# Patient Record
Sex: Male | Born: 1945 | Race: White | Hispanic: No | Marital: Married | Smoking: Never smoker
Health system: Southern US, Community
[De-identification: ages and names within clinical notes are randomized; demographics above are authoritative.]

---

## 2008-07-03 ENCOUNTER — Ambulatory Visit: Payer: Self-pay | Admitting: Internal Medicine

## 2008-08-12 ENCOUNTER — Ambulatory Visit: Payer: Self-pay | Admitting: Internal Medicine

## 2008-09-12 ENCOUNTER — Ambulatory Visit: Payer: Self-pay | Admitting: Surgery

## 2008-09-18 ENCOUNTER — Ambulatory Visit: Payer: Self-pay | Admitting: Surgery

## 2009-12-18 ENCOUNTER — Ambulatory Visit: Payer: Self-pay | Admitting: Unknown Physician Specialty

## 2010-01-27 ENCOUNTER — Ambulatory Visit: Payer: Self-pay | Admitting: Family Medicine

## 2010-03-26 ENCOUNTER — Encounter: Payer: Self-pay | Admitting: Neurological Surgery

## 2010-03-28 ENCOUNTER — Encounter: Payer: Self-pay | Admitting: Neurological Surgery

## 2010-04-28 ENCOUNTER — Encounter: Payer: Self-pay | Admitting: Neurological Surgery

## 2010-12-02 ENCOUNTER — Encounter
Admission: RE | Admit: 2010-12-02 | Discharge: 2010-12-02 | Payer: Self-pay | Source: Home / Self Care | Attending: Neurological Surgery | Admitting: Neurological Surgery

## 2011-01-17 ENCOUNTER — Other Ambulatory Visit: Payer: Self-pay | Admitting: Neurological Surgery

## 2011-01-17 DIAGNOSIS — M47812 Spondylosis without myelopathy or radiculopathy, cervical region: Secondary | ICD-10-CM

## 2011-01-19 ENCOUNTER — Other Ambulatory Visit: Payer: Self-pay

## 2011-01-19 ENCOUNTER — Ambulatory Visit
Admission: RE | Admit: 2011-01-19 | Discharge: 2011-01-19 | Disposition: A | Discharge status: Home / Self Care | Payer: 59 | Source: Ambulatory Visit | Attending: Neurological Surgery | Admitting: Neurological Surgery

## 2011-01-19 DIAGNOSIS — M47812 Spondylosis without myelopathy or radiculopathy, cervical region: Secondary | ICD-10-CM

## 2011-04-08 ENCOUNTER — Other Ambulatory Visit: Payer: Self-pay | Admitting: Neurological Surgery

## 2011-04-08 DIAGNOSIS — M47812 Spondylosis without myelopathy or radiculopathy, cervical region: Secondary | ICD-10-CM

## 2011-04-12 ENCOUNTER — Ambulatory Visit
Admission: RE | Admit: 2011-04-12 | Discharge: 2011-04-12 | Disposition: A | Discharge status: Home / Self Care | Payer: 59 | Source: Ambulatory Visit | Attending: Neurological Surgery | Admitting: Neurological Surgery

## 2011-04-12 DIAGNOSIS — M47812 Spondylosis without myelopathy or radiculopathy, cervical region: Secondary | ICD-10-CM

## 2011-05-03 ENCOUNTER — Ambulatory Visit: Payer: Self-pay | Admitting: Internal Medicine

## 2011-06-20 ENCOUNTER — Other Ambulatory Visit: Payer: Self-pay | Admitting: Neurological Surgery

## 2011-06-20 DIAGNOSIS — M47812 Spondylosis without myelopathy or radiculopathy, cervical region: Secondary | ICD-10-CM

## 2011-06-23 ENCOUNTER — Ambulatory Visit
Admission: RE | Admit: 2011-06-23 | Discharge: 2011-06-23 | Disposition: A | Discharge status: Home / Self Care | Payer: 59 | Source: Ambulatory Visit | Attending: Neurological Surgery | Admitting: Neurological Surgery

## 2011-06-23 DIAGNOSIS — M542 Cervicalgia: Secondary | ICD-10-CM

## 2011-06-23 DIAGNOSIS — M47812 Spondylosis without myelopathy or radiculopathy, cervical region: Secondary | ICD-10-CM

## 2011-06-23 MED ORDER — TRIAMCINOLONE ACETONIDE 40 MG/ML IJ SUSP (RADIOLOGY)
60.0000 mg | Freq: Once | INTRAMUSCULAR | Status: AC
  Administered 2011-06-23: 60 mg via EPIDURAL

## 2011-06-23 MED ORDER — IOHEXOL 300 MG/ML  SOLN
1.0000 mL | Freq: Once | INTRAMUSCULAR | Status: AC | PRN
  Administered 2011-06-23: 1 mL via EPIDURAL

## 2011-08-18 ENCOUNTER — Other Ambulatory Visit: Payer: Self-pay | Admitting: Neurological Surgery

## 2011-08-18 DIAGNOSIS — M47812 Spondylosis without myelopathy or radiculopathy, cervical region: Secondary | ICD-10-CM

## 2011-08-19 ENCOUNTER — Ambulatory Visit
Admission: RE | Admit: 2011-08-19 | Discharge: 2011-08-19 | Disposition: A | Discharge status: Home / Self Care | Payer: 59 | Source: Ambulatory Visit | Attending: Neurological Surgery | Admitting: Neurological Surgery

## 2011-08-19 DIAGNOSIS — M47812 Spondylosis without myelopathy or radiculopathy, cervical region: Secondary | ICD-10-CM

## 2011-08-19 MED ORDER — IOHEXOL 300 MG/ML  SOLN
1.0000 mL | Freq: Once | INTRAMUSCULAR | Status: AC | PRN
  Administered 2011-08-19: 1 mL via EPIDURAL

## 2011-08-19 MED ORDER — TRIAMCINOLONE ACETONIDE 40 MG/ML IJ SUSP (RADIOLOGY)
60.0000 mg | Freq: Once | INTRAMUSCULAR | Status: AC
  Administered 2011-08-19: 60 mg via EPIDURAL

## 2014-07-04 DIAGNOSIS — G4730 Sleep apnea, unspecified: Secondary | ICD-10-CM | POA: Insufficient documentation

## 2014-07-04 DIAGNOSIS — K219 Gastro-esophageal reflux disease without esophagitis: Secondary | ICD-10-CM | POA: Insufficient documentation

## 2014-07-29 ENCOUNTER — Ambulatory Visit (INDEPENDENT_AMBULATORY_CARE_PROVIDER_SITE_OTHER): Payer: Medicare Other | Admitting: Podiatry

## 2014-07-29 ENCOUNTER — Encounter: Payer: Self-pay | Admitting: Podiatry

## 2014-07-29 ENCOUNTER — Ambulatory Visit (INDEPENDENT_AMBULATORY_CARE_PROVIDER_SITE_OTHER): Payer: Medicare Other

## 2014-07-29 VITALS — BP 121/79 | HR 76 | Resp 16 | Ht 70.0 in | Wt 185.0 lb

## 2014-07-29 DIAGNOSIS — M722 Plantar fascial fibromatosis: Secondary | ICD-10-CM

## 2014-07-29 MED ORDER — DICLOFENAC SODIUM 75 MG PO TBEC: 75.0000 mg | DELAYED_RELEASE_TABLET | Freq: Two times a day (BID) | ORAL | Status: AC

## 2014-07-29 MED ORDER — TRIAMCINOLONE ACETONIDE 10 MG/ML IJ SUSP
10.0000 mg | Freq: Once | INTRAMUSCULAR | Status: AC
  Administered 2014-07-29: 10 mg

## 2014-07-29 NOTE — Progress Notes (Signed)
   Subjective:    Patient ID: Derek Cohen, male    DOB: 20-Nov-1946, 68 y.o.   MRN: [id number]  HPI Comments: Its the bottom of my rt foot. Its been going on for a couple of months. My foot has remained the same. It doesn't hurt all the time. It hurts when im up walking until it stretches out. It hurts after sitting. i will try to walk it out, i roll a tennis ball on my foot, soaks in warm soapy water.  Foot Pain      Review of Systems  HENT:       Ringing in ears   Musculoskeletal:       Difficulty walking   Neurological: Positive for light-headedness.  Hematological: Bruises/bleeds easily.  All other systems reviewed and are negative.      Objective:   Physical Exam        Assessment & Plan:

## 2014-07-29 NOTE — Progress Notes (Signed)
Subjective:     Patient ID: Derek Cohen, male   DOB: 1946-05-14, 68 y.o.   MRN: [id number]  Foot Pain   patient presents stating I'm having a lot of pain underneath my right heel that's been going on for about 6 months and has worsened over the last few months   Review of Systems  All other systems reviewed and are negative.      Objective:   Physical Exam  Nursing note and vitals reviewed. Constitutional: He is oriented to person, place, and time.  Cardiovascular: Intact distal pulses.   Musculoskeletal: Normal range of motion.  Neurological: He is oriented to person, place, and time.  Skin: Skin is warm.   neurovascular status intact with muscle strength adequate and range of motion subtalar midtarsal joint within normal limits. Patient's found to have good digital perfusion mild equinus and is well oriented x3. I noted exquisite discomfort plantar aspect right heel at the insertion of the tendon into the calcaneus    Assessment:     Plantar fasciitis acute right plantar heel    Plan:     H&P and x-rays reviewed and today I injected the plantar fascia right 3 mg Kenalog 5 mg Xylocaine and applied fascially brace and placed on oral anti-inflammatory diclofenac 75 mg twice a day. Reappoint her recheck in one week

## 2014-07-29 NOTE — Patient Instructions (Signed)

## 2014-08-05 ENCOUNTER — Ambulatory Visit (INDEPENDENT_AMBULATORY_CARE_PROVIDER_SITE_OTHER): Payer: Medicare Other | Admitting: Podiatry

## 2014-08-05 VITALS — BP 111/73 | HR 72 | Resp 16

## 2014-08-05 DIAGNOSIS — M722 Plantar fascial fibromatosis: Secondary | ICD-10-CM

## 2014-08-05 MED ORDER — TRIAMCINOLONE ACETONIDE 10 MG/ML IJ SUSP: 10.0000 mg | Freq: Once | INTRAMUSCULAR | Status: AC

## 2014-08-05 NOTE — Progress Notes (Signed)
Subjective:     Patient ID: Derek Cohen, male   DOB: [birth year], 68 y.o.   MRN: [id number]  HPI patient presents stating my right heel has been improved but it still is tender when I get up in the morning or after sitting   Review of Systems     Objective:   Physical Exam Neurovascular status intact with   pain in the right heel that still is present upon palpation Assessment:     Plantar fasciitis right with inflammation and fluid noted    Plan:     Reviewed physical therapy and the continuation of oral medicines and injected the right plantar fashion 3 mg Kenalog 5 mg Xylocaine and instructed on supportive shoe gear

## 2014-08-26 ENCOUNTER — Ambulatory Visit: Payer: Medicare Other | Admitting: Podiatry

## 2014-12-04 ENCOUNTER — Ambulatory Visit: Payer: Self-pay | Admitting: Unknown Physician Specialty

## 2015-03-23 LAB — SURGICAL PATHOLOGY

## 2016-01-20 DIAGNOSIS — L570 Actinic keratosis: Secondary | ICD-10-CM | POA: Diagnosis not present

## 2016-01-20 DIAGNOSIS — D225 Melanocytic nevi of trunk: Secondary | ICD-10-CM | POA: Diagnosis not present

## 2016-01-22 DIAGNOSIS — L570 Actinic keratosis: Secondary | ICD-10-CM | POA: Diagnosis not present

## 2016-01-22 DIAGNOSIS — X32XXXA Exposure to sunlight, initial encounter: Secondary | ICD-10-CM | POA: Diagnosis not present

## 2016-03-01 DIAGNOSIS — L570 Actinic keratosis: Secondary | ICD-10-CM | POA: Diagnosis not present

## 2016-07-28 DIAGNOSIS — G4733 Obstructive sleep apnea (adult) (pediatric): Secondary | ICD-10-CM | POA: Diagnosis not present

## 2016-07-28 DIAGNOSIS — Z0001 Encounter for general adult medical examination with abnormal findings: Secondary | ICD-10-CM | POA: Diagnosis not present

## 2016-07-28 DIAGNOSIS — Z125 Encounter for screening for malignant neoplasm of prostate: Secondary | ICD-10-CM | POA: Diagnosis not present

## 2016-09-29 DIAGNOSIS — G4733 Obstructive sleep apnea (adult) (pediatric): Secondary | ICD-10-CM | POA: Diagnosis not present

## 2017-01-04 DIAGNOSIS — D225 Melanocytic nevi of trunk: Secondary | ICD-10-CM | POA: Diagnosis not present

## 2017-01-04 DIAGNOSIS — D2261 Melanocytic nevi of right upper limb, including shoulder: Secondary | ICD-10-CM | POA: Diagnosis not present

## 2017-01-04 DIAGNOSIS — L570 Actinic keratosis: Secondary | ICD-10-CM | POA: Diagnosis not present

## 2017-01-04 DIAGNOSIS — D485 Neoplasm of uncertain behavior of skin: Secondary | ICD-10-CM | POA: Diagnosis not present

## 2017-01-04 DIAGNOSIS — D2262 Melanocytic nevi of left upper limb, including shoulder: Secondary | ICD-10-CM | POA: Diagnosis not present

## 2017-01-04 DIAGNOSIS — D2272 Melanocytic nevi of left lower limb, including hip: Secondary | ICD-10-CM | POA: Diagnosis not present

## 2017-01-04 DIAGNOSIS — X32XXXA Exposure to sunlight, initial encounter: Secondary | ICD-10-CM | POA: Diagnosis not present

## 2017-01-18 DIAGNOSIS — D225 Melanocytic nevi of trunk: Secondary | ICD-10-CM | POA: Diagnosis not present

## 2017-01-18 DIAGNOSIS — L905 Scar conditions and fibrosis of skin: Secondary | ICD-10-CM | POA: Diagnosis not present

## 2017-01-18 DIAGNOSIS — D485 Neoplasm of uncertain behavior of skin: Secondary | ICD-10-CM | POA: Diagnosis not present

## 2017-04-12 DIAGNOSIS — G4733 Obstructive sleep apnea (adult) (pediatric): Secondary | ICD-10-CM | POA: Diagnosis not present

## 2017-07-17 DIAGNOSIS — H2513 Age-related nuclear cataract, bilateral: Secondary | ICD-10-CM | POA: Diagnosis not present

## 2017-07-19 DIAGNOSIS — G4733 Obstructive sleep apnea (adult) (pediatric): Secondary | ICD-10-CM | POA: Diagnosis not present

## 2017-07-21 DIAGNOSIS — D2261 Melanocytic nevi of right upper limb, including shoulder: Secondary | ICD-10-CM | POA: Diagnosis not present

## 2017-07-21 DIAGNOSIS — D2272 Melanocytic nevi of left lower limb, including hip: Secondary | ICD-10-CM | POA: Diagnosis not present

## 2017-07-21 DIAGNOSIS — L570 Actinic keratosis: Secondary | ICD-10-CM | POA: Diagnosis not present

## 2017-07-21 DIAGNOSIS — D225 Melanocytic nevi of trunk: Secondary | ICD-10-CM | POA: Diagnosis not present

## 2017-09-21 DIAGNOSIS — Z0000 Encounter for general adult medical examination without abnormal findings: Secondary | ICD-10-CM | POA: Diagnosis not present

## 2017-09-21 DIAGNOSIS — G4733 Obstructive sleep apnea (adult) (pediatric): Secondary | ICD-10-CM | POA: Diagnosis not present

## 2017-09-21 DIAGNOSIS — K219 Gastro-esophageal reflux disease without esophagitis: Secondary | ICD-10-CM | POA: Diagnosis not present

## 2017-09-21 DIAGNOSIS — Z23 Encounter for immunization: Secondary | ICD-10-CM | POA: Diagnosis not present

## 2018-01-03 DIAGNOSIS — D225 Melanocytic nevi of trunk: Secondary | ICD-10-CM | POA: Diagnosis not present

## 2018-01-03 DIAGNOSIS — Z872 Personal history of diseases of the skin and subcutaneous tissue: Secondary | ICD-10-CM | POA: Diagnosis not present

## 2018-01-03 DIAGNOSIS — W540XXA Bitten by dog, initial encounter: Secondary | ICD-10-CM | POA: Diagnosis not present

## 2018-01-03 DIAGNOSIS — D2261 Melanocytic nevi of right upper limb, including shoulder: Secondary | ICD-10-CM | POA: Diagnosis not present

## 2018-03-14 DIAGNOSIS — H16102 Unspecified superficial keratitis, left eye: Secondary | ICD-10-CM | POA: Diagnosis not present

## 2018-03-15 DIAGNOSIS — Z0000 Encounter for general adult medical examination without abnormal findings: Secondary | ICD-10-CM | POA: Diagnosis not present

## 2018-03-27 DIAGNOSIS — Z13220 Encounter for screening for lipoid disorders: Secondary | ICD-10-CM | POA: Diagnosis not present

## 2018-03-27 DIAGNOSIS — G4733 Obstructive sleep apnea (adult) (pediatric): Secondary | ICD-10-CM | POA: Diagnosis not present

## 2018-03-27 DIAGNOSIS — M25562 Pain in left knee: Secondary | ICD-10-CM | POA: Diagnosis not present

## 2018-03-27 DIAGNOSIS — K219 Gastro-esophageal reflux disease without esophagitis: Secondary | ICD-10-CM | POA: Diagnosis not present

## 2019-01-02 DIAGNOSIS — D2262 Melanocytic nevi of left upper limb, including shoulder: Secondary | ICD-10-CM | POA: Diagnosis not present

## 2019-01-02 DIAGNOSIS — Z09 Encounter for follow-up examination after completed treatment for conditions other than malignant neoplasm: Secondary | ICD-10-CM | POA: Diagnosis not present

## 2019-01-02 DIAGNOSIS — D2261 Melanocytic nevi of right upper limb, including shoulder: Secondary | ICD-10-CM | POA: Diagnosis not present

## 2019-01-02 DIAGNOSIS — B001 Herpesviral vesicular dermatitis: Secondary | ICD-10-CM | POA: Diagnosis not present

## 2019-01-02 DIAGNOSIS — D225 Melanocytic nevi of trunk: Secondary | ICD-10-CM | POA: Diagnosis not present

## 2019-01-02 DIAGNOSIS — D2272 Melanocytic nevi of left lower limb, including hip: Secondary | ICD-10-CM | POA: Diagnosis not present

## 2019-01-02 DIAGNOSIS — S00511A Abrasion of lip, initial encounter: Secondary | ICD-10-CM | POA: Diagnosis not present

## 2019-01-02 DIAGNOSIS — J209 Acute bronchitis, unspecified: Secondary | ICD-10-CM | POA: Diagnosis not present

## 2019-01-02 DIAGNOSIS — D2271 Melanocytic nevi of right lower limb, including hip: Secondary | ICD-10-CM | POA: Diagnosis not present

## 2019-01-02 DIAGNOSIS — Z872 Personal history of diseases of the skin and subcutaneous tissue: Secondary | ICD-10-CM | POA: Diagnosis not present

## 2019-02-06 DIAGNOSIS — M940 Chondrocostal junction syndrome [Tietze]: Secondary | ICD-10-CM | POA: Diagnosis not present

## 2019-02-06 DIAGNOSIS — J209 Acute bronchitis, unspecified: Secondary | ICD-10-CM | POA: Diagnosis not present

## 2019-02-06 DIAGNOSIS — R350 Frequency of micturition: Secondary | ICD-10-CM | POA: Diagnosis not present

## 2019-02-12 DIAGNOSIS — G4733 Obstructive sleep apnea (adult) (pediatric): Secondary | ICD-10-CM | POA: Diagnosis not present

## 2019-03-21 DIAGNOSIS — S2231XA Fracture of one rib, right side, initial encounter for closed fracture: Secondary | ICD-10-CM | POA: Diagnosis not present

## 2019-06-13 DIAGNOSIS — K219 Gastro-esophageal reflux disease without esophagitis: Secondary | ICD-10-CM | POA: Diagnosis not present

## 2019-06-13 DIAGNOSIS — G4733 Obstructive sleep apnea (adult) (pediatric): Secondary | ICD-10-CM | POA: Diagnosis not present

## 2019-06-13 DIAGNOSIS — Z125 Encounter for screening for malignant neoplasm of prostate: Secondary | ICD-10-CM | POA: Diagnosis not present

## 2019-06-13 DIAGNOSIS — Z0000 Encounter for general adult medical examination without abnormal findings: Secondary | ICD-10-CM | POA: Diagnosis not present

## 2019-06-13 DIAGNOSIS — Z0001 Encounter for general adult medical examination with abnormal findings: Secondary | ICD-10-CM | POA: Diagnosis not present

## 2019-06-13 DIAGNOSIS — H8110 Benign paroxysmal vertigo, unspecified ear: Secondary | ICD-10-CM | POA: Diagnosis not present

## 2019-09-30 ENCOUNTER — Ambulatory Visit: Payer: PPO | Admitting: Podiatry

## 2019-09-30 ENCOUNTER — Ambulatory Visit (INDEPENDENT_AMBULATORY_CARE_PROVIDER_SITE_OTHER): Payer: PPO

## 2019-09-30 ENCOUNTER — Other Ambulatory Visit: Payer: Self-pay | Admitting: Podiatry

## 2019-09-30 ENCOUNTER — Other Ambulatory Visit: Payer: Self-pay

## 2019-09-30 DIAGNOSIS — M7672 Peroneal tendinitis, left leg: Secondary | ICD-10-CM

## 2019-09-30 DIAGNOSIS — M779 Enthesopathy, unspecified: Secondary | ICD-10-CM | POA: Diagnosis not present

## 2019-09-30 NOTE — Progress Notes (Signed)
  Subjective:  Patient ID: Derek Cohen, male    DOB: 1946/01/26,  MRN: [id number]  No chief complaint on file.   73 y.o. male presents with the above complaint.  Patient states that this is a new complaint onset that has been happening for past 3 weeks it hurts on the right foot lateral side of the foot pain.  No other treatments have been done.  No injury or trauma to the area.  He was last seen by Dr. Paulla Dolly 5 years ago for Lanter fasciitis pain.  He denies any other acute complaints.  He has been ambulating in regular sneakers.   Review of Systems: Negative except as noted in the HPI. Denies N/V/F/Ch.  History reviewed. No pertinent past medical history.  Current Outpatient Medications:  .  fluticasone (FLONASE) 50 MCG/ACT nasal spray, 1 spray q nare qd, Disp: , Rfl:  .  diclofenac (VOLTAREN) 75 MG EC tablet, Take 1 tablet (75 mg total) by mouth 2 (two) times daily., Disp: 50 tablet, Rfl: 2  Current Facility-Administered Medications:  .  triamcinolone acetonide (KENALOG) 10 MG/ML injection 10 mg, 10 mg, Other, Once, Regal, Tamala Fothergill, DPM  Social History   Tobacco Use  Smoking Status Never Smoker    Allergies  Allergen Reactions  . Other Other (See Comments)    Seasonal allergies: runny nose, sneezing, etc   Objective:  There were no vitals filed for this visit. There is no height or weight on file to calculate BMI. Constitutional Well developed. Well nourished.  Vascular Dorsalis pedis pulses palpable bilaterally. Posterior tibial pulses palpable bilaterally. Capillary refill normal to all digits.  No cyanosis or clubbing noted. Pedal hair growth normal.  Neurologic Normal speech. Oriented to person, place, and time. Epicritic sensation to light touch grossly present bilaterally.  Dermatologic Nails well groomed and normal in appearance. No open wounds. No skin lesions.  Orthopedic:  Pain on palpation to the right peroneal tendon insertion.  No pain along the course  of the peroneal tendon.  Pain on inversion active and passive of the foot.  No pain on eversion dorsiflexion or plantarflexion of the foot.  No pain at the ATFL, Achilles tendon, tibialis posterior tendon.   Radiographs: 3 views of skeletally mature adult.  Posterior and plantar spurring noted.  Small ossicle is noted at the insertion peroneal tendon of the fifth metatarsal base.  No arthritic changes noted at the fifth metatarsal base.  No fractures noted.  No foreign body noted. Assessment:   1. Capsulitis   2. Peroneal tendinitis, left    Plan:  Patient was evaluated and treated and all questions answered.  Right peroneal tendinitis/capsulitis -I explained to the patient the etiology of the right peroneal tendon inflammation with an associated capsulitis of the fifth metatarsal base.  I discussed all treatment options available to help decrease inflammation. -I believe patient will benefit from a steroid injection as performed below.  Patient was also was placed in a cam boot to aggressively offload the fifth metatarsal base and the inflammation of the tendon -Cam boot was dispensed -A steroid injection was performed at right fifth MPJ using 1% plain Lidocaine and 10 mg of Kenalog. This was well tolerated.  Injection was given around the fifth metatarsal base and not within the tendon insertion.   Return in about 4 weeks (around 10/28/2019).

## 2019-10-01 ENCOUNTER — Encounter: Payer: Self-pay | Admitting: Podiatry

## 2019-10-28 ENCOUNTER — Ambulatory Visit: Payer: PPO | Admitting: Podiatry

## 2020-01-01 DIAGNOSIS — D2262 Melanocytic nevi of left upper limb, including shoulder: Secondary | ICD-10-CM | POA: Diagnosis not present

## 2020-01-01 DIAGNOSIS — X32XXXA Exposure to sunlight, initial encounter: Secondary | ICD-10-CM | POA: Diagnosis not present

## 2020-01-01 DIAGNOSIS — D2272 Melanocytic nevi of left lower limb, including hip: Secondary | ICD-10-CM | POA: Diagnosis not present

## 2020-01-01 DIAGNOSIS — L853 Xerosis cutis: Secondary | ICD-10-CM | POA: Diagnosis not present

## 2020-01-01 DIAGNOSIS — L570 Actinic keratosis: Secondary | ICD-10-CM | POA: Diagnosis not present

## 2020-01-01 DIAGNOSIS — D225 Melanocytic nevi of trunk: Secondary | ICD-10-CM | POA: Diagnosis not present

## 2020-01-01 DIAGNOSIS — D2261 Melanocytic nevi of right upper limb, including shoulder: Secondary | ICD-10-CM | POA: Diagnosis not present

## 2020-01-23 DIAGNOSIS — M940 Chondrocostal junction syndrome [Tietze]: Secondary | ICD-10-CM | POA: Diagnosis not present

## 2020-03-25 DIAGNOSIS — G4733 Obstructive sleep apnea (adult) (pediatric): Secondary | ICD-10-CM | POA: Diagnosis not present

## 2020-04-30 DIAGNOSIS — H2513 Age-related nuclear cataract, bilateral: Secondary | ICD-10-CM | POA: Diagnosis not present

## 2020-04-30 DIAGNOSIS — H1045 Other chronic allergic conjunctivitis: Secondary | ICD-10-CM | POA: Diagnosis not present

## 2020-06-15 DIAGNOSIS — Z0000 Encounter for general adult medical examination without abnormal findings: Secondary | ICD-10-CM | POA: Diagnosis not present

## 2020-06-15 DIAGNOSIS — Z0001 Encounter for general adult medical examination with abnormal findings: Secondary | ICD-10-CM | POA: Diagnosis not present

## 2020-06-15 DIAGNOSIS — G4733 Obstructive sleep apnea (adult) (pediatric): Secondary | ICD-10-CM | POA: Diagnosis not present

## 2020-06-15 DIAGNOSIS — K219 Gastro-esophageal reflux disease without esophagitis: Secondary | ICD-10-CM | POA: Diagnosis not present

## 2020-08-18 DIAGNOSIS — Z01812 Encounter for preprocedural laboratory examination: Secondary | ICD-10-CM | POA: Diagnosis not present

## 2020-08-18 DIAGNOSIS — Z86010 Personal history of colonic polyps: Secondary | ICD-10-CM | POA: Diagnosis not present

## 2020-08-18 DIAGNOSIS — K5730 Diverticulosis of large intestine without perforation or abscess without bleeding: Secondary | ICD-10-CM | POA: Diagnosis not present

## 2020-08-18 DIAGNOSIS — G4733 Obstructive sleep apnea (adult) (pediatric): Secondary | ICD-10-CM | POA: Diagnosis not present

## 2020-08-18 DIAGNOSIS — Z9989 Dependence on other enabling machines and devices: Secondary | ICD-10-CM | POA: Diagnosis not present

## 2020-08-25 DIAGNOSIS — Z01812 Encounter for preprocedural laboratory examination: Secondary | ICD-10-CM | POA: Diagnosis not present

## 2020-08-28 DIAGNOSIS — K5730 Diverticulosis of large intestine without perforation or abscess without bleeding: Secondary | ICD-10-CM | POA: Diagnosis not present

## 2020-08-28 DIAGNOSIS — Z86010 Personal history of colonic polyps: Secondary | ICD-10-CM | POA: Diagnosis not present

## 2020-08-28 DIAGNOSIS — K640 First degree hemorrhoids: Secondary | ICD-10-CM | POA: Diagnosis not present

## 2020-10-16 ENCOUNTER — Other Ambulatory Visit: Payer: Self-pay

## 2020-10-16 ENCOUNTER — Ambulatory Visit: Payer: PPO | Admitting: Podiatry

## 2020-10-16 DIAGNOSIS — M7672 Peroneal tendinitis, left leg: Secondary | ICD-10-CM | POA: Diagnosis not present

## 2020-10-16 DIAGNOSIS — M779 Enthesopathy, unspecified: Secondary | ICD-10-CM

## 2020-10-20 ENCOUNTER — Encounter: Payer: Self-pay | Admitting: Podiatry

## 2020-10-20 NOTE — Progress Notes (Signed)
Subjective:  Patient ID: Derek Cohen, male    DOB: Feb 05, 1946,  MRN: [id number]  Chief Complaint  Patient presents with  . Foot Pain    place on foot on the lateral side PT stated that it has a burning sensation    74 y.o. male presents with the above complaint.  Patient presents with a new complaint of pain to the left lateral foot.  Patient was seen by me on the right side for peroneal tendinitis which was resolved with injection and immobilization however now he has pain to the left side which is exactly the same type of pain.  He would like to know if he could get another steroid shot in the left side and discuss future treatment options for it.  He denies any other acute complaints.  He has not seen anyone else prior to seeing me.  He has burning sensation.  Is tender to touch.  He would also like to know if he can follow-up in [place] in the future.   Review of Systems: Negative except as noted in the HPI. Denies N/V/F/Ch.  No past medical history on file.  Current Outpatient Medications:  .  diclofenac (VOLTAREN) 75 MG EC tablet, Take 1 tablet (75 mg total) by mouth 2 (two) times daily., Disp: 50 tablet, Rfl: 2 .  fluticasone (FLONASE) 50 MCG/ACT nasal spray, 1 spray q nare qd, Disp: , Rfl:   Current Facility-Administered Medications:  .  triamcinolone acetonide (KENALOG) 10 MG/ML injection 10 mg, 10 mg, Other, Once, Regal, Tamala Fothergill, DPM  Social History   Tobacco Use  Smoking Status Never Smoker    Allergies  Allergen Reactions  . Other Other (See Comments)    Seasonal allergies: runny nose, sneezing, etc   Objective:  There were no vitals filed for this visit. There is no height or weight on file to calculate BMI. Constitutional Well developed. Well nourished.  Vascular Dorsalis pedis pulses palpable bilaterally. Posterior tibial pulses palpable bilaterally. Capillary refill normal to all digits.  No cyanosis or clubbing noted. Pedal hair growth normal.    Neurologic Normal speech. Oriented to person, place, and time. Epicritic sensation to light touch grossly present bilaterally.  Dermatologic Nails well groomed and normal in appearance. No open wounds. No skin lesions.  Orthopedic:  Pain on palpation to the left  peroneal tendon insertion.  No pain along the course of the peroneal tendon.  Pain on inversion active and passive of the foot.  No pain on eversion dorsiflexion or plantarflexion of the foot.  No pain at the ATFL, Achilles tendon, tibialis posterior tendon.   Radiographs: 3 views of skeletally mature adult.  Posterior and plantar spurring noted.  Small ossicle is noted at the insertion peroneal tendon of the fifth metatarsal base.  No arthritic changes noted at the fifth metatarsal base.  No fractures noted.  No foreign body noted. Assessment:   1. Peroneal tendinitis, left   2. Capsulitis    Plan:  Patient was evaluated and treated and all questions answered.  Left peroneal tendinitis/capsulitis -I explained to the patient the etiology of the right peroneal tendon inflammation with an associated capsulitis of the fifth metatarsal base.  I discussed all treatment options available to help decrease inflammation. -I believe patient will benefit from a steroid injection as performed below.  Patient was also was placed in a cam boot to aggressively offload the fifth metatarsal base and the inflammation of the tendon -He will place himself back in the cam  boot. -A steroid injection was performed at left fifth MPJ using 1% plain Lidocaine and 10 mg of Kenalog. This was well tolerated.  Injection was given around the fifth metatarsal base and not within the tendon insertion.   No follow-ups on file.

## 2020-11-12 ENCOUNTER — Ambulatory Visit: Payer: PPO | Admitting: Podiatry

## 2020-12-07 DIAGNOSIS — Z20822 Contact with and (suspected) exposure to covid-19: Secondary | ICD-10-CM | POA: Diagnosis not present

## 2020-12-31 DIAGNOSIS — D2271 Melanocytic nevi of right lower limb, including hip: Secondary | ICD-10-CM | POA: Diagnosis not present

## 2020-12-31 DIAGNOSIS — L570 Actinic keratosis: Secondary | ICD-10-CM | POA: Diagnosis not present

## 2020-12-31 DIAGNOSIS — D2272 Melanocytic nevi of left lower limb, including hip: Secondary | ICD-10-CM | POA: Diagnosis not present

## 2020-12-31 DIAGNOSIS — D2262 Melanocytic nevi of left upper limb, including shoulder: Secondary | ICD-10-CM | POA: Diagnosis not present

## 2020-12-31 DIAGNOSIS — D2261 Melanocytic nevi of right upper limb, including shoulder: Secondary | ICD-10-CM | POA: Diagnosis not present

## 2020-12-31 DIAGNOSIS — D225 Melanocytic nevi of trunk: Secondary | ICD-10-CM | POA: Diagnosis not present

## 2020-12-31 DIAGNOSIS — X32XXXA Exposure to sunlight, initial encounter: Secondary | ICD-10-CM | POA: Diagnosis not present

## 2021-04-06 DIAGNOSIS — G4733 Obstructive sleep apnea (adult) (pediatric): Secondary | ICD-10-CM | POA: Diagnosis not present

## 2021-11-01 DIAGNOSIS — K219 Gastro-esophageal reflux disease without esophagitis: Secondary | ICD-10-CM | POA: Diagnosis not present

## 2021-11-01 DIAGNOSIS — Z0001 Encounter for general adult medical examination with abnormal findings: Secondary | ICD-10-CM | POA: Diagnosis not present

## 2021-11-01 DIAGNOSIS — L989 Disorder of the skin and subcutaneous tissue, unspecified: Secondary | ICD-10-CM | POA: Diagnosis not present

## 2021-11-01 DIAGNOSIS — G4733 Obstructive sleep apnea (adult) (pediatric): Secondary | ICD-10-CM | POA: Diagnosis not present

## 2021-11-01 DIAGNOSIS — Z0000 Encounter for general adult medical examination without abnormal findings: Secondary | ICD-10-CM | POA: Diagnosis not present

## 2021-11-04 DIAGNOSIS — R208 Other disturbances of skin sensation: Secondary | ICD-10-CM | POA: Diagnosis not present

## 2021-11-04 DIAGNOSIS — C44229 Squamous cell carcinoma of skin of left ear and external auricular canal: Secondary | ICD-10-CM | POA: Diagnosis not present

## 2021-11-04 DIAGNOSIS — D485 Neoplasm of uncertain behavior of skin: Secondary | ICD-10-CM | POA: Diagnosis not present

## 2021-11-05 DIAGNOSIS — L989 Disorder of the skin and subcutaneous tissue, unspecified: Secondary | ICD-10-CM | POA: Diagnosis not present

## 2021-11-05 DIAGNOSIS — Z0000 Encounter for general adult medical examination without abnormal findings: Secondary | ICD-10-CM | POA: Diagnosis not present

## 2021-11-05 DIAGNOSIS — G4733 Obstructive sleep apnea (adult) (pediatric): Secondary | ICD-10-CM | POA: Diagnosis not present

## 2021-11-05 DIAGNOSIS — Z0001 Encounter for general adult medical examination with abnormal findings: Secondary | ICD-10-CM | POA: Diagnosis not present

## 2021-11-05 DIAGNOSIS — Z125 Encounter for screening for malignant neoplasm of prostate: Secondary | ICD-10-CM | POA: Diagnosis not present

## 2021-11-05 DIAGNOSIS — K219 Gastro-esophageal reflux disease without esophagitis: Secondary | ICD-10-CM | POA: Diagnosis not present

## 2021-12-10 DIAGNOSIS — L905 Scar conditions and fibrosis of skin: Secondary | ICD-10-CM | POA: Diagnosis not present

## 2021-12-10 DIAGNOSIS — C44229 Squamous cell carcinoma of skin of left ear and external auricular canal: Secondary | ICD-10-CM | POA: Diagnosis not present

## 2021-12-10 DIAGNOSIS — L218 Other seborrheic dermatitis: Secondary | ICD-10-CM | POA: Diagnosis not present

## 2021-12-14 DIAGNOSIS — Z48817 Encounter for surgical aftercare following surgery on the skin and subcutaneous tissue: Secondary | ICD-10-CM | POA: Diagnosis not present

## 2022-03-23 DIAGNOSIS — G4733 Obstructive sleep apnea (adult) (pediatric): Secondary | ICD-10-CM | POA: Diagnosis not present

## 2022-05-19 DIAGNOSIS — R1084 Generalized abdominal pain: Secondary | ICD-10-CM | POA: Diagnosis not present

## 2022-05-19 DIAGNOSIS — R109 Unspecified abdominal pain: Secondary | ICD-10-CM | POA: Diagnosis not present

## 2022-05-26 DIAGNOSIS — D225 Melanocytic nevi of trunk: Secondary | ICD-10-CM | POA: Diagnosis not present

## 2022-05-26 DIAGNOSIS — D2272 Melanocytic nevi of left lower limb, including hip: Secondary | ICD-10-CM | POA: Diagnosis not present

## 2022-05-26 DIAGNOSIS — D2261 Melanocytic nevi of right upper limb, including shoulder: Secondary | ICD-10-CM | POA: Diagnosis not present

## 2022-05-26 DIAGNOSIS — L570 Actinic keratosis: Secondary | ICD-10-CM | POA: Diagnosis not present

## 2022-05-26 DIAGNOSIS — Z85828 Personal history of other malignant neoplasm of skin: Secondary | ICD-10-CM | POA: Diagnosis not present

## 2022-06-20 DIAGNOSIS — Z20822 Contact with and (suspected) exposure to covid-19: Secondary | ICD-10-CM | POA: Diagnosis not present

## 2022-06-20 DIAGNOSIS — J4520 Mild intermittent asthma, uncomplicated: Secondary | ICD-10-CM | POA: Diagnosis not present

## 2022-06-20 DIAGNOSIS — R059 Cough, unspecified: Secondary | ICD-10-CM | POA: Diagnosis not present

## 2022-07-22 DIAGNOSIS — B9689 Other specified bacterial agents as the cause of diseases classified elsewhere: Secondary | ICD-10-CM | POA: Diagnosis not present

## 2022-07-22 DIAGNOSIS — R059 Cough, unspecified: Secondary | ICD-10-CM | POA: Diagnosis not present

## 2022-07-22 DIAGNOSIS — J208 Acute bronchitis due to other specified organisms: Secondary | ICD-10-CM | POA: Diagnosis not present

## 2022-08-26 DIAGNOSIS — J40 Bronchitis, not specified as acute or chronic: Secondary | ICD-10-CM | POA: Diagnosis not present

## 2022-09-02 DIAGNOSIS — H524 Presbyopia: Secondary | ICD-10-CM | POA: Diagnosis not present

## 2022-09-19 DIAGNOSIS — G4733 Obstructive sleep apnea (adult) (pediatric): Secondary | ICD-10-CM | POA: Diagnosis not present

## 2022-11-11 DIAGNOSIS — Z125 Encounter for screening for malignant neoplasm of prostate: Secondary | ICD-10-CM | POA: Diagnosis not present

## 2022-11-11 DIAGNOSIS — Z0000 Encounter for general adult medical examination without abnormal findings: Secondary | ICD-10-CM | POA: Diagnosis not present

## 2022-11-18 DIAGNOSIS — Z0000 Encounter for general adult medical examination without abnormal findings: Secondary | ICD-10-CM | POA: Diagnosis not present

## 2022-11-18 DIAGNOSIS — Z0001 Encounter for general adult medical examination with abnormal findings: Secondary | ICD-10-CM | POA: Diagnosis not present

## 2022-11-18 DIAGNOSIS — Z23 Encounter for immunization: Secondary | ICD-10-CM | POA: Diagnosis not present

## 2022-11-18 DIAGNOSIS — K219 Gastro-esophageal reflux disease without esophagitis: Secondary | ICD-10-CM | POA: Diagnosis not present

## 2022-11-18 DIAGNOSIS — G4733 Obstructive sleep apnea (adult) (pediatric): Secondary | ICD-10-CM | POA: Diagnosis not present

## 2022-11-18 DIAGNOSIS — Z125 Encounter for screening for malignant neoplasm of prostate: Secondary | ICD-10-CM | POA: Diagnosis not present

## 2022-12-08 DIAGNOSIS — D2272 Melanocytic nevi of left lower limb, including hip: Secondary | ICD-10-CM | POA: Diagnosis not present

## 2022-12-08 DIAGNOSIS — D225 Melanocytic nevi of trunk: Secondary | ICD-10-CM | POA: Diagnosis not present

## 2022-12-08 DIAGNOSIS — Z85828 Personal history of other malignant neoplasm of skin: Secondary | ICD-10-CM | POA: Diagnosis not present

## 2022-12-08 DIAGNOSIS — Z872 Personal history of diseases of the skin and subcutaneous tissue: Secondary | ICD-10-CM | POA: Diagnosis not present

## 2022-12-08 DIAGNOSIS — D2261 Melanocytic nevi of right upper limb, including shoulder: Secondary | ICD-10-CM | POA: Diagnosis not present

## 2023-03-06 DIAGNOSIS — G4733 Obstructive sleep apnea (adult) (pediatric): Secondary | ICD-10-CM | POA: Diagnosis not present

## 2023-05-19 DIAGNOSIS — Z125 Encounter for screening for malignant neoplasm of prostate: Secondary | ICD-10-CM | POA: Diagnosis not present

## 2023-05-22 DIAGNOSIS — J069 Acute upper respiratory infection, unspecified: Secondary | ICD-10-CM | POA: Diagnosis not present

## 2023-05-22 DIAGNOSIS — R059 Cough, unspecified: Secondary | ICD-10-CM | POA: Diagnosis not present

## 2023-06-02 DIAGNOSIS — J019 Acute sinusitis, unspecified: Secondary | ICD-10-CM | POA: Diagnosis not present

## 2023-06-02 DIAGNOSIS — B9689 Other specified bacterial agents as the cause of diseases classified elsewhere: Secondary | ICD-10-CM | POA: Diagnosis not present

## 2023-06-02 DIAGNOSIS — R5383 Other fatigue: Secondary | ICD-10-CM | POA: Diagnosis not present

## 2023-06-02 DIAGNOSIS — J3489 Other specified disorders of nose and nasal sinuses: Secondary | ICD-10-CM | POA: Diagnosis not present

## 2023-07-24 DIAGNOSIS — H903 Sensorineural hearing loss, bilateral: Secondary | ICD-10-CM | POA: Diagnosis not present

## 2023-08-18 DIAGNOSIS — G4733 Obstructive sleep apnea (adult) (pediatric): Secondary | ICD-10-CM | POA: Diagnosis not present

## 2023-11-17 DIAGNOSIS — Z125 Encounter for screening for malignant neoplasm of prostate: Secondary | ICD-10-CM | POA: Diagnosis not present

## 2023-11-17 DIAGNOSIS — Z0000 Encounter for general adult medical examination without abnormal findings: Secondary | ICD-10-CM | POA: Diagnosis not present

## 2023-12-01 DIAGNOSIS — H2511 Age-related nuclear cataract, right eye: Secondary | ICD-10-CM | POA: Diagnosis not present

## 2023-12-01 DIAGNOSIS — Z0001 Encounter for general adult medical examination with abnormal findings: Secondary | ICD-10-CM | POA: Diagnosis not present

## 2023-12-01 DIAGNOSIS — Z23 Encounter for immunization: Secondary | ICD-10-CM | POA: Diagnosis not present

## 2023-12-01 DIAGNOSIS — K219 Gastro-esophageal reflux disease without esophagitis: Secondary | ICD-10-CM | POA: Diagnosis not present

## 2023-12-01 DIAGNOSIS — G4733 Obstructive sleep apnea (adult) (pediatric): Secondary | ICD-10-CM | POA: Diagnosis not present

## 2023-12-01 DIAGNOSIS — Z0000 Encounter for general adult medical examination without abnormal findings: Secondary | ICD-10-CM | POA: Diagnosis not present

## 2023-12-01 DIAGNOSIS — H2512 Age-related nuclear cataract, left eye: Secondary | ICD-10-CM | POA: Diagnosis not present

## 2024-01-12 DIAGNOSIS — L570 Actinic keratosis: Secondary | ICD-10-CM | POA: Diagnosis not present

## 2024-01-12 DIAGNOSIS — D2271 Melanocytic nevi of right lower limb, including hip: Secondary | ICD-10-CM | POA: Diagnosis not present

## 2024-01-12 DIAGNOSIS — D2261 Melanocytic nevi of right upper limb, including shoulder: Secondary | ICD-10-CM | POA: Diagnosis not present

## 2024-01-12 DIAGNOSIS — D2272 Melanocytic nevi of left lower limb, including hip: Secondary | ICD-10-CM | POA: Diagnosis not present

## 2024-01-12 DIAGNOSIS — D225 Melanocytic nevi of trunk: Secondary | ICD-10-CM | POA: Diagnosis not present

## 2024-01-12 DIAGNOSIS — D2262 Melanocytic nevi of left upper limb, including shoulder: Secondary | ICD-10-CM | POA: Diagnosis not present

## 2024-01-12 DIAGNOSIS — Z85828 Personal history of other malignant neoplasm of skin: Secondary | ICD-10-CM | POA: Diagnosis not present

## 2024-01-12 DIAGNOSIS — L821 Other seborrheic keratosis: Secondary | ICD-10-CM | POA: Diagnosis not present

## 2024-01-13 DIAGNOSIS — G4733 Obstructive sleep apnea (adult) (pediatric): Secondary | ICD-10-CM | POA: Diagnosis not present

## 2024-06-17 DIAGNOSIS — G4733 Obstructive sleep apnea (adult) (pediatric): Secondary | ICD-10-CM | POA: Diagnosis not present
